# Patient Record
Sex: Male | Born: 1990 | Race: White | Hispanic: No | Marital: Single | Smoking: Current every day smoker
Health system: Southern US, Community
[De-identification: ages and names within clinical notes are randomized; demographics above are authoritative.]

---
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
## 2009-04-23 ENCOUNTER — Emergency Department (HOSPITAL_COMMUNITY): Admission: EM | Admit: 2009-04-23 | Discharge: 2009-04-23 | Payer: Self-pay | Admitting: Emergency Medicine

## 2018-01-01 ENCOUNTER — Emergency Department (HOSPITAL_COMMUNITY): Payer: No Typology Code available for payment source

## 2018-01-01 ENCOUNTER — Emergency Department (HOSPITAL_COMMUNITY)
Admission: EM | Admit: 2018-01-01 | Discharge: 2018-01-01 | Disposition: A | Discharge status: Home / Self Care | Payer: No Typology Code available for payment source | Attending: Emergency Medicine | Admitting: Emergency Medicine

## 2018-01-01 ENCOUNTER — Encounter (HOSPITAL_COMMUNITY): Payer: Self-pay | Admitting: Emergency Medicine

## 2018-01-01 ENCOUNTER — Other Ambulatory Visit: Payer: Self-pay

## 2018-01-01 DIAGNOSIS — Y939 Activity, unspecified: Secondary | ICD-10-CM | POA: Insufficient documentation

## 2018-01-01 DIAGNOSIS — S40021A Contusion of right upper arm, initial encounter: Secondary | ICD-10-CM | POA: Diagnosis not present

## 2018-01-01 DIAGNOSIS — Y92410 Unspecified street and highway as the place of occurrence of the external cause: Secondary | ICD-10-CM | POA: Insufficient documentation

## 2018-01-01 DIAGNOSIS — M542 Cervicalgia: Secondary | ICD-10-CM | POA: Insufficient documentation

## 2018-01-01 DIAGNOSIS — R51 Headache: Secondary | ICD-10-CM | POA: Diagnosis not present

## 2018-01-01 DIAGNOSIS — M79651 Pain in right thigh: Secondary | ICD-10-CM | POA: Diagnosis not present

## 2018-01-01 DIAGNOSIS — M79652 Pain in left thigh: Secondary | ICD-10-CM | POA: Insufficient documentation

## 2018-01-01 DIAGNOSIS — S301XXA Contusion of abdominal wall, initial encounter: Secondary | ICD-10-CM | POA: Diagnosis not present

## 2018-01-01 DIAGNOSIS — S40022A Contusion of left upper arm, initial encounter: Secondary | ICD-10-CM | POA: Diagnosis not present

## 2018-01-01 DIAGNOSIS — S0001XA Abrasion of scalp, initial encounter: Secondary | ICD-10-CM | POA: Insufficient documentation

## 2018-01-01 DIAGNOSIS — T07XXXA Unspecified multiple injuries, initial encounter: Secondary | ICD-10-CM

## 2018-01-01 DIAGNOSIS — Y999 Unspecified external cause status: Secondary | ICD-10-CM | POA: Insufficient documentation

## 2018-01-01 LAB — COMPREHENSIVE METABOLIC PANEL
ALT: 160 U/L — ABNORMAL HIGH (ref 0–44)
AST: 74 U/L — AB (ref 15–41)
Albumin: 4.1 g/dL (ref 3.5–5.0)
Alkaline Phosphatase: 74 U/L (ref 38–126)
Anion gap: 11 (ref 5–15)
BILIRUBIN TOTAL: 0.6 mg/dL (ref 0.3–1.2)
BUN: 11 mg/dL (ref 6–20)
CO2: 21 mmol/L — ABNORMAL LOW (ref 22–32)
CREATININE: 1.04 mg/dL (ref 0.61–1.24)
Calcium: 9 mg/dL (ref 8.9–10.3)
Chloride: 108 mmol/L (ref 98–111)
Glucose, Bld: 95 mg/dL (ref 70–99)
POTASSIUM: 3.9 mmol/L (ref 3.5–5.1)
Sodium: 140 mmol/L (ref 135–145)
TOTAL PROTEIN: 7.6 g/dL (ref 6.5–8.1)

## 2018-01-01 LAB — LIPASE, BLOOD: LIPASE: 29 U/L (ref 11–51)

## 2018-01-01 LAB — URINALYSIS, ROUTINE W REFLEX MICROSCOPIC
Bilirubin Urine: NEGATIVE
Glucose, UA: NEGATIVE mg/dL
Hgb urine dipstick: NEGATIVE
KETONES UR: NEGATIVE mg/dL
LEUKOCYTES UA: NEGATIVE
NITRITE: NEGATIVE
PROTEIN: NEGATIVE mg/dL
Specific Gravity, Urine: 1.021 (ref 1.005–1.030)
pH: 5 (ref 5.0–8.0)

## 2018-01-01 LAB — CBC WITH DIFFERENTIAL/PLATELET
BASOS ABS: 0.1 10*3/uL (ref 0.0–0.1)
Basophils Relative: 0 %
EOS ABS: 0.1 10*3/uL (ref 0.0–0.7)
EOS PCT: 1 %
HCT: 44 % (ref 39.0–52.0)
Hemoglobin: 14.4 g/dL (ref 13.0–17.0)
Lymphocytes Relative: 19 %
Lymphs Abs: 3 10*3/uL (ref 0.7–4.0)
MCH: 27.4 pg (ref 26.0–34.0)
MCHC: 32.7 g/dL (ref 30.0–36.0)
MCV: 83.8 fL (ref 78.0–100.0)
Monocytes Absolute: 1.1 10*3/uL — ABNORMAL HIGH (ref 0.1–1.0)
Monocytes Relative: 6 %
NEUTROS PCT: 74 %
Neutro Abs: 12.1 10*3/uL — ABNORMAL HIGH (ref 1.7–7.7)
PLATELETS: 290 10*3/uL (ref 150–400)
RBC: 5.25 MIL/uL (ref 4.22–5.81)
RDW: 14.4 % (ref 11.5–15.5)
WBC: 16.3 10*3/uL — AB (ref 4.0–10.5)

## 2018-01-01 LAB — CK: CK TOTAL: 484 U/L — AB (ref 49–397)

## 2018-01-01 MED ORDER — FENTANYL CITRATE (PF) 100 MCG/2ML IJ SOLN
50.0000 ug | Freq: Once | INTRAMUSCULAR | Status: AC
Start: 2018-01-01 — End: 2018-01-01
  Administered 2018-01-01: 50 ug via INTRAVENOUS
  Filled 2018-01-01: qty 2

## 2018-01-01 MED ORDER — IOPAMIDOL (ISOVUE-300) INJECTION 61%
100.0000 mL | Freq: Once | INTRAVENOUS | Status: AC | PRN
  Administered 2018-01-01: 100 mL via INTRAVENOUS

## 2018-01-01 MED ORDER — ACETAMINOPHEN 325 MG PO TABS: 650.0000 mg | ORAL_TABLET | Freq: Four times a day (QID) | ORAL | 0 refills | Status: DC | PRN

## 2018-01-01 NOTE — ED Notes (Addendum)
Pt took of c-collar and refuses to wear it.

## 2018-01-01 NOTE — ED Notes (Signed)
ED Provider at bedside. 
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
## 2018-01-01 NOTE — ED Notes (Signed)
Patient transported to CT 
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
## 2018-02-15 ENCOUNTER — Other Ambulatory Visit: Payer: Self-pay

## 2018-02-15 ENCOUNTER — Emergency Department (HOSPITAL_COMMUNITY)
Admission: EM | Admit: 2018-02-15 | Discharge: 2018-02-15 | Disposition: A | Discharge status: Home / Self Care | Payer: Self-pay | Attending: Emergency Medicine | Admitting: Emergency Medicine

## 2018-02-15 ENCOUNTER — Encounter (HOSPITAL_COMMUNITY): Payer: Self-pay | Admitting: *Deleted

## 2018-02-15 ENCOUNTER — Emergency Department (HOSPITAL_COMMUNITY): Payer: Self-pay

## 2018-02-15 DIAGNOSIS — J40 Bronchitis, not specified as acute or chronic: Secondary | ICD-10-CM | POA: Insufficient documentation

## 2018-02-15 DIAGNOSIS — F17210 Nicotine dependence, cigarettes, uncomplicated: Secondary | ICD-10-CM | POA: Insufficient documentation

## 2018-02-15 DIAGNOSIS — Z79899 Other long term (current) drug therapy: Secondary | ICD-10-CM | POA: Insufficient documentation

## 2018-02-15 DIAGNOSIS — J209 Acute bronchitis, unspecified: Secondary | ICD-10-CM

## 2018-02-15 MED ORDER — IBUPROFEN 800 MG PO TABS
800.0000 mg | ORAL_TABLET | Freq: Once | ORAL | Status: AC
  Administered 2018-02-15: 800 mg via ORAL
  Filled 2018-02-15: qty 1

## 2018-02-15 MED ORDER — ALBUTEROL SULFATE (2.5 MG/3ML) 0.083% IN NEBU
2.5000 mg | INHALATION_SOLUTION | Freq: Once | RESPIRATORY_TRACT | Status: AC
  Administered 2018-02-15: 2.5 mg via RESPIRATORY_TRACT
  Filled 2018-02-15: qty 3

## 2018-02-15 MED ORDER — PREDNISONE 20 MG PO TABS
40.0000 mg | ORAL_TABLET | Freq: Once | ORAL | Status: AC
  Administered 2018-02-15: 40 mg via ORAL
  Filled 2018-02-15: qty 2

## 2018-02-15 MED ORDER — AZITHROMYCIN 250 MG PO TABS
500.0000 mg | ORAL_TABLET | Freq: Once | ORAL | Status: AC
  Administered 2018-02-15: 500 mg via ORAL
  Filled 2018-02-15: qty 2

## 2018-02-15 MED ORDER — DEXAMETHASONE 4 MG PO TABS: 4.0000 mg | ORAL_TABLET | Freq: Two times a day (BID) | ORAL | 0 refills | Status: DC

## 2018-02-15 MED ORDER — ALBUTEROL SULFATE HFA 108 (90 BASE) MCG/ACT IN AERS
2.0000 | INHALATION_SPRAY | Freq: Once | RESPIRATORY_TRACT | Status: AC
  Administered 2018-02-15: 2 via RESPIRATORY_TRACT
  Filled 2018-02-15: qty 6.7

## 2018-02-15 MED ORDER — AZITHROMYCIN 250 MG PO TABS: ORAL_TABLET | ORAL | 0 refills | Status: DC

## 2018-02-15 NOTE — ED Triage Notes (Signed)
Pt c/o cough with congestion x one week; pt states he has been coughing up green, white, yellow sputum

## 2018-02-15 NOTE — ED Provider Notes (Signed)
[facility] EMERGENCY DEPARTMENT Provider Note   CSN: [id number] Arrival date & time: 02/15/18  1007     History   Chief Complaint Chief Complaint  Patient presents with  . Cough    HPI Cameron Logan is a 27 y.o. male.  The history is provided by the patient.  URI   This is a new problem. The current episode started more than 1 week ago. The problem has been gradually worsening. The maximum temperature recorded prior to his arrival was 101 to 101.9 F. The fever has been present for 3 to 4 days. Associated symptoms include abdominal pain, diarrhea, nausea, vomiting, congestion, headaches, sneezing, sore throat and cough. Pertinent negatives include no chest pain, no dysuria, no neck pain, no rash and no wheezing. He has tried other medications (dayquil and nyquil and robatussin) for the symptoms. The treatment provided mild relief.    History reviewed. No pertinent past medical history.  There are no active problems to display for this patient.   Past Surgical History:  Procedure Laterality Date  . CLUB FOOT RELEASE  1989/08/12        Home Medications    Prior to Admission medications   Medication Sig Start Date End Date Taking? Authorizing Provider  acetaminophen (TYLENOL) 325 MG tablet Take 2 tablets (650 mg total) by mouth every 6 (six) hours as needed. 01/01/18   Mesner, Barbara Cower, MD    Family History History reviewed. No pertinent family history.  Social History Social History   Tobacco Use  . Smoking status: Current Every Day Smoker    Packs/day: 1.00    Types: Cigarettes  . Smokeless tobacco: Never Used  Substance Use Topics  . Alcohol use: Yes  . Drug use: Yes    Types: Marijuana     Allergies   Sulfa antibiotics   Review of Systems Review of Systems  Constitutional: Positive for chills and fever. Negative for activity change.       All ROS Neg except as noted in HPI  HENT: Positive for congestion, sneezing and sore throat. Negative for  nosebleeds.   Eyes: Negative for photophobia and discharge.  Respiratory: Positive for cough. Negative for shortness of breath and wheezing.   Cardiovascular: Negative for chest pain and palpitations.  Gastrointestinal: Positive for abdominal pain, diarrhea, nausea and vomiting. Negative for blood in stool.  Genitourinary: Negative for dysuria, frequency and hematuria.  Musculoskeletal: Negative for arthralgias, back pain and neck pain.  Skin: Negative.  Negative for rash.  Neurological: Positive for headaches. Negative for dizziness, seizures and speech difficulty.  Psychiatric/Behavioral: Negative for confusion and hallucinations.     Physical Exam Updated Vital Signs BP 121/63 (BP Location: Right Arm)   Pulse 91   Temp 98.3 F (36.8 C) (Oral)   Resp 18   Ht 5' 8.5" (1.74 m)   Wt 83.9 kg   SpO2 98%   BMI 27.72 kg/m   Physical Exam  Constitutional: He is oriented to person, place, and time. He appears well-developed and well-nourished.  Non-toxic appearance.  HENT:  Head: Normocephalic.  Right Ear: Tympanic membrane and external ear normal.  Left Ear: Tympanic membrane and external ear normal.  Nasal congestion  Eyes: Pupils are equal, round, and reactive to light. EOM and lids are normal.  Neck: Normal range of motion. Neck supple. Carotid bruit is not present.  Cardiovascular: Normal rate, regular rhythm, normal heart sounds, intact distal pulses and normal pulses.  Pulmonary/Chest: Breath sounds normal. No respiratory distress.  Abdominal:  Soft. Bowel sounds are normal. There is no tenderness. There is no guarding.  Musculoskeletal: Normal range of motion.  Lymphadenopathy:       Head (right side): No submandibular adenopathy present.       Head (left side): No submandibular adenopathy present.    He has no cervical adenopathy.  Neurological: He is alert and oriented to person, place, and time. He has normal strength. No cranial nerve deficit or sensory deficit.  Skin:  Skin is warm and dry.  Psychiatric: He has a normal mood and affect. His speech is normal.  Nursing note and vitals reviewed.    ED Treatments / Results  Labs (all labs ordered are listed, but only abnormal results are displayed) Labs Reviewed - No data to display  EKG None  Radiology No results found.  Procedures Procedures (including critical care time)  Medications Ordered in ED Medications - No data to display   Initial Impression / Assessment and Plan / ED Course  I have reviewed the triage vital signs and the nursing notes.  Pertinent labs & imaging results that were available during my care of the patient were reviewed by me and considered in my medical decision making (see chart for details).      Final Clinical Impressions(s) / ED Diagnoses MDM  Vital signs reviewed.  Pulse oximetry is 96% on room air.  Within normal limits by my interpretation.  Patient treated with albuterol inhaler and nebulizer.  Chest x-ray shows a stable chest with no active cardiopulmonary disease.  Examination favors bronchitis. Patient advised to use the albuterol 2 puffs every 4 hours.  He is prescribed Decadron 2 times daily and Zithromax daily.  Have asked him to increase fluids, and to wash hands frequently.  I have provided a mask for the patient and asked him to use until symptoms are resolved.   Final diagnoses:  Acute bronchitis, unspecified organism    ED Discharge Orders         Ordered    azithromycin (ZITHROMAX) 250 MG tablet     02/15/18 1223    dexamethasone (DECADRON) 4 MG tablet  2 times daily with meals     02/15/18 1223           Ivery Quale, PA-C 02/15/18 2047    Bethann Berkshire, MD 02/16/18 [phone]

## 2018-02-15 NOTE — Discharge Instructions (Addendum)
Vital signs are within normal limits.  Chest x-ray is negative for pneumonia or acute problem.  Your examination favors acute bronchitis.  Please use Zithromax 1 tablet daily starting September 5.  Please use 2 puffs of albuterol every 4 hours.  Use Decadron 2 times daily with food.  Please increase fluids.  Please wash hands frequently.  Use mask until symptoms have resolved.

## 2018-08-31 ENCOUNTER — Other Ambulatory Visit: Payer: Self-pay

## 2018-08-31 ENCOUNTER — Ambulatory Visit (INDEPENDENT_AMBULATORY_CARE_PROVIDER_SITE_OTHER): Payer: Commercial Managed Care - PPO

## 2018-08-31 ENCOUNTER — Ambulatory Visit (INDEPENDENT_AMBULATORY_CARE_PROVIDER_SITE_OTHER): Payer: Commercial Managed Care - PPO | Admitting: Physician Assistant

## 2018-08-31 ENCOUNTER — Encounter: Payer: Self-pay | Admitting: Physician Assistant

## 2018-08-31 VITALS — BP 140/92 | HR 85 | Temp 97.8°F | Ht 68.5 in | Wt 200.0 lb

## 2018-08-31 DIAGNOSIS — M25551 Pain in right hip: Secondary | ICD-10-CM

## 2018-08-31 DIAGNOSIS — M25552 Pain in left hip: Secondary | ICD-10-CM

## 2018-08-31 DIAGNOSIS — M25561 Pain in right knee: Secondary | ICD-10-CM | POA: Diagnosis not present

## 2018-08-31 DIAGNOSIS — G8929 Other chronic pain: Secondary | ICD-10-CM

## 2018-08-31 DIAGNOSIS — M25562 Pain in left knee: Secondary | ICD-10-CM

## 2018-08-31 MED ORDER — MELOXICAM 7.5 MG PO TABS: 7.5000 mg | ORAL_TABLET | Freq: Every day | ORAL | 1 refills | Status: AC

## 2018-08-31 NOTE — Patient Instructions (Signed)
Knee Exercises Ask your health care provider which exercises are safe for you. Do exercises exactly as told by your health care provider and adjust them as directed. It is normal to feel mild stretching, pulling, tightness, or discomfort as you do these exercises, but you should stop right away if you feel sudden pain or your pain gets worse.Do not begin these exercises until told by your health care provider. STRETCHING AND RANGE OF MOTION EXERCISES These exercises warm up your muscles and joints and improve the movement and flexibility of your knee. These exercises also help to relieve pain, numbness, and tingling. Exercise A: Knee Extension, Prone 1. Lie on your abdomen on a bed. 2. Place your left / right knee just beyond the edge of the surface so your knee is not on the bed. You can put a towel under your left / right thigh just above your knee for comfort. 3. Relax your leg muscles and allow gravity to straighten your knee. You should feel a stretch behind your left / right knee. 4. Hold this position for __________ seconds. 5. Scoot up so your knee is supported between repetitions. Repeat __________ times. Complete this stretch __________ times a day. Exercise B: Knee Flexion, Active  1. Lie on your back with both knees straight. If this causes back discomfort, bend your left / right knee so your foot is flat on the floor. 2. Slowly slide your left / right heel back toward your buttocks until you feel a gentle stretch in the front of your knee or thigh. 3. Hold this position for __________ seconds. 4. Slowly slide your left / right heel back to the starting position. Repeat __________ times. Complete this exercise __________ times a day. Exercise C: Quadriceps, Prone  1. Lie on your abdomen on a firm surface, such as a bed or padded floor. 2. Bend your left / right knee and hold your ankle. If you cannot reach your ankle or pant leg, loop a belt around your foot and grab the belt  instead. 3. Gently pull your heel toward your buttocks. Your knee should not slide out to the side. You should feel a stretch in the front of your thigh and knee. 4. Hold this position for __________ seconds. Repeat __________ times. Complete this stretch __________ times a day. Exercise D: Hamstring, Supine 1. Lie on your back. 2. Loop a belt or towel over the ball of your left / right foot. The ball of your foot is on the walking surface, right under your toes. 3. Straighten your left / right knee and slowly pull on the belt to raise your leg until you feel a gentle stretch behind your knee. ? Do not let your left / right knee bend while you do this. ? Keep your other leg flat on the floor. 4. Hold this position for __________ seconds. Repeat __________ times. Complete this stretch __________ times a day. STRENGTHENING EXERCISES These exercises build strength and endurance in your knee. Endurance is the ability to use your muscles for a long time, even after they get tired. Exercise E: Quadriceps, Isometric  1. Lie on your back with your left / right leg extended and your other knee bent. Put a rolled towel or small pillow under your knee if told by your health care provider. 2. Slowly tense the muscles in the front of your left / right thigh. You should see your kneecap slide up toward your hip or see increased dimpling just above the knee. This   motion will push the back of the knee toward the floor. 3. For __________ seconds, keep the muscle as tight as you can without increasing your pain. 4. Relax the muscles slowly and completely. Repeat __________ times. Complete this exercise __________ times a day. Exercise F: Straight Leg Raises - Quadriceps 1. Lie on your back with your left / right leg extended and your other knee bent. 2. Tense the muscles in the front of your left / right thigh. You should see your kneecap slide up or see increased dimpling just above the knee. Your thigh may  even shake a bit. 3. Keep these muscles tight as you raise your leg 4-6 inches (10-15 cm) off the floor. Do not let your knee bend. 4. Hold this position for __________ seconds. 5. Keep these muscles tense as you lower your leg. 6. Relax your muscles slowly and completely after each repetition. Repeat __________ times. Complete this exercise __________ times a day. Exercise G: Hamstring, Isometric 1. Lie on your back on a firm surface. 2. Bend your left / right knee approximately __________ degrees. 3. Dig your left / right heel into the surface as if you are trying to pull it toward your buttocks. Tighten the muscles in the back of your thighs to dig as hard as you can without increasing any pain. 4. Hold this position for __________ seconds. 5. Release the tension gradually and allow your muscles to relax completely for __________ seconds after each repetition. Repeat __________ times. Complete this exercise __________ times a day. Exercise H: Hamstring Curls  If told by your health care provider, do this exercise while wearing ankle weights. Begin with __________ weights. Then increase the weight by 1 lb (0.5 kg) increments. Do not wear ankle weights that are more than __________. 1. Lie on your abdomen with your legs straight. 2. Bend your left / right knee as far as you can without feeling pain. Keep your hips flat against the floor. 3. Hold this position for __________ seconds. 4. Slowly lower your leg to the starting position.  Repeat __________ times. Complete this exercise __________ times a day. Exercise I: Squats (Quadriceps) 1. Stand in front of a table, with your feet and knees pointing straight ahead. You may rest your hands on the table for balance but not for support. 2. Slowly bend your knees and lower your hips like you are going to sit in a chair. ? Keep your weight over your heels, not over your toes. ? Keep your lower legs upright so they are parallel with the table  legs. ? Do not let your hips go lower than your knees. ? Do not bend lower than told by your health care provider. ? If your knee pain increases, do not bend as low. 3. Hold the squat position for __________ seconds. 4. Slowly push with your legs to return to standing. Do not use your hands to pull yourself to standing. Repeat __________ times. Complete this exercise __________ times a day. Exercise J: Wall Slides (Quadriceps)  1. Lean your back against a smooth wall or door while you walk your feet out 18-24 inches (46-61 cm) from it. 2. Place your feet hip-width apart. 3. Slowly slide down the wall or door until your knees bend __________ degrees. Keep your knees over your heels, not over your toes. Keep your knees in line with your hips. 4. Hold for __________ seconds. Repeat __________ times. Complete this exercise __________ times a day. Exercise K: Straight Leg Raises -   Hip Abductors 1. Lie on your side with your left / right leg in the top position. Lie so your head, shoulder, knee, and hip line up. You may bend your bottom knee to help you keep your balance. 2. Roll your hips slightly forward so your hips are stacked directly over each other and your left / right knee is facing forward. 3. Leading with your heel, lift your top leg 4-6 inches (10-15 cm). You should feel the muscles in your outer hip lifting. ? Do not let your foot drift forward. ? Do not let your knee roll toward the ceiling. 4. Hold this position for __________ seconds. 5. Slowly return your leg to the starting position. 6. Let your muscles relax completely after each repetition. Repeat __________ times. Complete this exercise __________ times a day. Exercise L: Straight Leg Raises - Hip Extensors 1. Lie on your abdomen on a firm surface. You can put a pillow under your hips if that is more comfortable. 2. Tense the muscles in your buttocks and lift your left / right leg about 4-6 inches (10-15 cm). Keep your knee  straight as you lift your leg. 3. Hold this position for __________ seconds. 4. Slowly lower your leg to the starting position. 5. Let your leg relax completely after each repetition. Repeat __________ times. Complete this exercise __________ times a day. This information is not intended to replace advice given to you by your health care provider. Make sure you discuss any questions you have with your health care provider. Document Released: 04/14/2005 Document Revised: 02/23/2016 Document Reviewed: 04/06/2015 [employer] Interactive Patient Education  2018 [employer] Hip Exercises Ask your health care provider which exercises are safe for you. Do exercises exactly as told by your health care provider and adjust them as directed. It is normal to feel mild stretching, pulling, tightness, or discomfort as you do these exercises, but you should stop right away if you feel sudden pain or your pain gets worse.Do not begin these exercises until told by your health care provider. Stretching and range of motion exercises These exercises warm up your muscles and joints and improve the movement and flexibility of your hip. These exercises also help to relieve pain, numbness, and tingling. Exercise A: Hamstrings, supine  1. Lie on your back. 2. Loop a belt or towel over the ball of your left / rightfoot. The ball of your foot is on the walking surface, right under your toes. 3. Straighten your left / rightknee and slowly pull on the belt to raise your leg. ? Do not let your left / right knee bend while you do this. ? Keep your other leg flat on the floor. ? Raise the left / right leg until you feel a gentle stretch behind your left / right knee or thigh. 4. Hold this position for __________ seconds. 5. Slowly return your leg to the starting position. Repeat __________ times. Complete this stretch __________ times a day. Exercise B: Hip rotators  1. Lie on your back on a firm surface. 2. Hold your  left / right knee with your left / right hand. Hold your ankle with your other hand. 3. Gently pull your left / right knee and rotate your lower leg toward your other shoulder. ? Pull until you feel a stretch in your buttocks. ? Keep your hips and shoulders firmly planted while you do this stretch. 4. Hold this position for __________ seconds. Repeat __________ times. Complete this stretch __________ times a day. Exercise   C: V-sit (hamstrings and adductors)  1. Sit on the floor with your legs extended in a large "V" shape. Keep your knees straight during this exercise. 2. Start with your head and chest upright, then bend at your waist to reach for your left foot (position A). You should feel a stretch in your right inner thigh. 3. Hold this position for __________ seconds. Then slowly return to the upright position. 4. Bend at your waist to reach forward (position B). You should feel a stretch behind both of your thighs and knees. 5. Hold this position for __________ seconds. Then slowly return to the upright position. 6. Bend at your waist to reach for your right foot (position C). You should feel a stretch in your left inner thigh. 7. Hold this position for __________ seconds. Then slowly return to the upright position. Repeat __________ times. Complete this stretch __________ times a day. Exercise D: Lunge (hip flexors)  1. Place your left / right knee on the floor and bend your other knee so that is directly over your ankle. You should be half-kneeling. 2. Keep good posture with your head over your shoulders. 3. Tighten your buttocks to point your tailbone downward. This helps your back to keep from arching too much. 4. You should feel a gentle stretch in the front of your left / right thigh and hip. If you do not feel any resistance, slightly slide your other foot forward and then slowly lunge forward so your knee once again lines up over your ankle. 5. Make sure your tailbone continues to  point downward. 6. Hold this position for __________ seconds. Repeat __________ times. Complete this stretch __________ times a day. Strengthening exercises These exercises build strength and endurance in your hip. Endurance is the ability to use your muscles for a long time, even after they get tired. Exercise E: Bridge (hip extensors)  1. Lie on your back on a firm surface with your knees bent and your feet flat on the floor. 2. Tighten your buttocks muscles and lift your bottom off the floor until the trunk of your body is level with your thighs. ? Do not arch your back. ? You should feel the muscles working in your buttocks and the back of your thighs. If you do not feel these muscles, slide your feet 1-2 inches (2.5-5 cm) farther away from your buttocks. 3. Hold this position for __________ seconds. 4. Slowly lower your hips to the starting position. 5. Let your muscles relax completely between repetitions. 6. If this exercise is too easy, try doing it with your arms crossed over your chest. Repeat __________ times. Complete this exercise __________ times a day. Exercise F: Straight leg raises - hip abductors  1. Lie on your side with your left / right leg in the top position. Lie so your head, shoulder, knee, and hip line up with each other. You may bend your bottom knee to help you balance. 2. Roll your hips slightly forward, so your hips are stacked directly over each other and your left / right knee is facing forward. 3. Leading with your heel, lift your top leg 4-6 inches (10-15 cm). You should feel the muscles in your outer hip lifting. ? Do not let your foot drift forward. ? Do not let your knee roll toward the ceiling. 4. Hold this position for __________ seconds. 5. Slowly return to the starting position. 6. Let your muscles relax completely between repetitions. Repeat __________ times. Complete this exercise __________ times   a day. Exercise G: Straight leg raises - hip  adductors  1. Lie on your side with your left / right leg in the bottom position. Lie so your head, shoulder, knee, and hip line up. You may place your upper foot in front to help you balance. 2. Roll your hips slightly forward, so your hips are stacked directly over each other and your left / right knee is facing forward. 3. Tense the muscles in your inner thigh and lift your bottom leg 4-6 inches (10-15 cm). 4. Hold this position for __________ seconds. 5. Slowly return to the starting position. 6. Let your muscles relax completely between repetitions. Repeat __________ times. Complete this exercise __________ times a day. Exercise H: Straight leg raises - quadriceps  1. Lie on your back with your left / right leg extended and your other knee bent. 2. Tense the muscles in the front of your left / right thigh. When you do this, you should see your kneecap slide up or see increased dimpling just above your knee. 3. Tighten these muscles even more and raise your leg 4-6 inches (10-15 cm) off the floor. 4. Hold this position for __________ seconds. 5. Keep these muscles tense as you lower your leg. 6. Relax the muscles slowly and completely between repetitions. Repeat __________ times. Complete this exercise __________ times a day. Exercise I: Hip abductors, standing 1. Tie one end of a rubber exercise band or tubing to a secure surface, such as a table or pole. 2. Loop the other end of the band or tubing around your left / right ankle. 3. Keeping your ankle with the band or tubing directly opposite of the secured end, step away until there is tension in the tubing or band. Hold onto a chair as needed for balance. 4. Lift your left / right leg out to your side. While you do this: ? Keep your back upright. ? Keep your shoulders over your hips. ? Keep your toes pointing forward. ? Make sure to use your hip muscles to lift your leg. Do not "throw" your leg or tip your body to lift your leg. 5.  Hold this position for __________ seconds. 6. Slowly return to the starting position. Repeat __________ times. Complete this exercise __________ times a day. Exercise J: Squats (quadriceps) 1. Stand in a door frame so your feet and knees are in line with the frame. You may place your hands on the frame for balance. 2. Slowly bend your knees and lower your hips like you are going to sit in a chair. ? Keep your lower legs in a straight-up-and-down position. ? Do not let your hips go lower than your knees. ? Do not bend your knees lower than told by your health care provider. ? If your hip pain increases, do not bend as low. 3. Hold this position for ___________ seconds. 4. Slowly push with your legs to return to standing. Do not use your hands to pull yourself to standing. Repeat __________ times. Complete this exercise __________ times a day. This information is not intended to replace advice given to you by your health care provider. Make sure you discuss any questions you have with your health care provider. Document Released: 06/18/2005 Document Revised: 10/04/2017 Document Reviewed: 05/26/2015 [employer] Interactive Patient Education  2019 [employer]  

## 2018-09-03 DIAGNOSIS — M25562 Pain in left knee: Secondary | ICD-10-CM

## 2018-09-03 DIAGNOSIS — M25552 Pain in left hip: Principal | ICD-10-CM

## 2018-09-03 DIAGNOSIS — M25551 Pain in right hip: Principal | ICD-10-CM

## 2018-09-03 DIAGNOSIS — M25561 Pain in right knee: Secondary | ICD-10-CM

## 2018-09-03 DIAGNOSIS — G8929 Other chronic pain: Secondary | ICD-10-CM | POA: Insufficient documentation

## 2018-09-03 NOTE — Progress Notes (Signed)
BP (!) 140/92   Pulse 85   Temp 97.8 F (36.6 C) (Oral)   Ht 5' 8.5" (1.74 m)   Wt 200 lb (90.7 kg)   BMI 29.97 kg/m    Subjective:    Patient ID: Cameron Logan, male    DOB: 1990-08-12, 28 y.o.   MRN: [id number]  HPI: Cameron Logan is a 28 y.o. male presenting on 08/31/2018 for New Patient (Initial Visit); Headache; and Generalized Body Aches (started Tuesday )  He comes in as a new patient and has reported chronic hip and knee pain. He played some sports as kid, but no severe injury. broken bone was collar bone age 11 and slight fracture of vertebrate age 59.  Pain will be in the posterior legs and worsened by standing on concrete floors.    History reviewed. No pertinent past medical history. Relevant past medical, surgical, family and social history reviewed and updated as indicated. Interim medical history since our last visit reviewed. Allergies and medications reviewed and updated. DATA REVIEWED: CHART IN EPIC  Family History reviewed for pertinent findings.  Review of Systems  Constitutional: Negative.  Negative for appetite change and fatigue.  Eyes: Negative for pain and visual disturbance.  Respiratory: Negative.  Negative for cough, chest tightness, shortness of breath and wheezing.   Cardiovascular: Negative.  Negative for chest pain, palpitations and leg swelling.  Gastrointestinal: Negative.  Negative for abdominal pain, diarrhea, nausea and vomiting.  Genitourinary: Negative.   Musculoskeletal: Positive for arthralgias, back pain and myalgias.  Skin: Negative.  Negative for color change and rash.  Neurological: Negative.  Negative for weakness, numbness and headaches.  Psychiatric/Behavioral: Negative.     Allergies as of 08/31/2018      Reactions   Sulfa Antibiotics Swelling      Medication List       Accurate as of August 31, 2018 11:59 PM. Always use your most recent med list.        meloxicam 7.5 MG tablet Commonly known as:  MOBIC Take  1 tablet (7.5 mg total) by mouth daily.          Objective:    BP (!) 140/92   Pulse 85   Temp 97.8 F (36.6 C) (Oral)   Ht 5' 8.5" (1.74 m)   Wt 200 lb (90.7 kg)   BMI 29.97 kg/m   Allergies  Allergen Reactions  . Sulfa Antibiotics Swelling    Wt Readings from Last 3 Encounters:  08/31/18 200 lb (90.7 kg)  02/15/18 185 lb (83.9 kg)  01/01/18 203 lb (92.1 kg)    Physical Exam Vitals signs and nursing note reviewed.  Constitutional:      General: He is not in acute distress.    Appearance: He is well-developed.  HENT:     Head: Normocephalic and atraumatic.  Eyes:     Conjunctiva/sclera: Conjunctivae normal.     Pupils: Pupils are equal, round, and reactive to light.  Cardiovascular:     Rate and Rhythm: Normal rate and regular rhythm.     Heart sounds: Normal heart sounds.  Pulmonary:     Effort: Pulmonary effort is normal. No respiratory distress.     Breath sounds: Normal breath sounds.  Musculoskeletal:     Left hip: He exhibits decreased range of motion and decreased strength.     Right knee: He exhibits no deformity. Tenderness found.     Left knee: He exhibits no deformity. Tenderness found.  Skin:  General: Skin is warm and dry.  Psychiatric:        Behavior: Behavior normal.     Results for orders placed or performed during the hospital encounter of 01/01/18  Comprehensive metabolic panel  Result Value Ref Range   Sodium 140 135 - 145 mmol/L   Potassium 3.9 3.5 - 5.1 mmol/L   Chloride 108 98 - 111 mmol/L   CO2 21 (L) 22 - 32 mmol/L   Glucose, Bld 95 70 - 99 mg/dL   BUN 11 6 - 20 mg/dL   Creatinine, Ser 0.24 0.61 - 1.24 mg/dL   Calcium 9.0 8.9 - 09.7 mg/dL   Total Protein 7.6 6.5 - 8.1 g/dL   Albumin 4.1 3.5 - 5.0 g/dL   AST 74 (H) 15 - 41 U/L   ALT 160 (H) 0 - 44 U/L   Alkaline Phosphatase 74 38 - 126 U/L   Total Bilirubin 0.6 0.3 - 1.2 mg/dL   GFR calc non Af Amer >60 >60 mL/min   GFR calc Af Amer >60 >60 mL/min   Anion gap 11 5 - 15   CBC with Differential  Result Value Ref Range   WBC 16.3 (H) 4.0 - 10.5 K/uL   RBC 5.25 4.22 - 5.81 MIL/uL   Hemoglobin 14.4 13.0 - 17.0 g/dL   HCT 35.3 29.9 - 24.2 %   MCV 83.8 78.0 - 100.0 fL   MCH 27.4 26.0 - 34.0 pg   MCHC 32.7 30.0 - 36.0 g/dL   RDW 68.3 41.9 - 62.2 %   Platelets 290 150 - 400 K/uL   Neutrophils Relative % 74 %   Neutro Abs 12.1 (H) 1.7 - 7.7 K/uL   Lymphocytes Relative 19 %   Lymphs Abs 3.0 0.7 - 4.0 K/uL   Monocytes Relative 6 %   Monocytes Absolute 1.1 (H) 0.1 - 1.0 K/uL   Eosinophils Relative 1 %   Eosinophils Absolute 0.1 0.0 - 0.7 K/uL   Basophils Relative 0 %   Basophils Absolute 0.1 0.0 - 0.1 K/uL  Lipase, blood  Result Value Ref Range   Lipase 29 11 - 51 U/L  Urinalysis, Routine w reflex microscopic  Result Value Ref Range   Color, Urine YELLOW YELLOW   APPearance HAZY (A) CLEAR   Specific Gravity, Urine 1.021 1.005 - 1.030   pH 5.0 5.0 - 8.0   Glucose, UA NEGATIVE NEGATIVE mg/dL   Hgb urine dipstick NEGATIVE NEGATIVE   Bilirubin Urine NEGATIVE NEGATIVE   Ketones, ur NEGATIVE NEGATIVE mg/dL   Protein, ur NEGATIVE NEGATIVE mg/dL   Nitrite NEGATIVE NEGATIVE   Leukocytes, UA NEGATIVE NEGATIVE  CK  Result Value Ref Range   Total CK 484 (H) 49 - 397 U/L      Assessment & Plan:   1. Chronic pain of both hips - DG HIP UNILAT W OR W/O PELVIS 2-3 VIEWS RIGHT; Future - meloxicam (MOBIC) 7.5 MG tablet; Take 1 tablet (7.5 mg total) by mouth daily.  Dispense: 90 tablet; Refill: 1  2. Chronic pain of both knees - DG Knee 1-2 Views Left; Future - meloxicam (MOBIC) 7.5 MG tablet; Take 1 tablet (7.5 mg total) by mouth daily.  Dispense: 90 tablet; Refill: 1   Continue all other maintenance medications as listed above.  Follow up plan: Return in about 3 months (around 12/01/2018).  Educational handout given for survey  Remus Loffler PA-C [facility] [address]  [place], [place] [address] [phone]   09/03/2018,  8:36 PM

## 2018-09-29 ENCOUNTER — Other Ambulatory Visit: Payer: Self-pay

## 2018-09-29 ENCOUNTER — Ambulatory Visit (INDEPENDENT_AMBULATORY_CARE_PROVIDER_SITE_OTHER): Payer: Commercial Managed Care - PPO | Admitting: Family Medicine

## 2018-09-29 DIAGNOSIS — K529 Noninfective gastroenteritis and colitis, unspecified: Secondary | ICD-10-CM

## 2018-09-29 MED ORDER — ONDANSETRON 4 MG PO TBDP: 4.0000 mg | ORAL_TABLET | Freq: Three times a day (TID) | ORAL | 0 refills | Status: AC | PRN

## 2018-09-29 NOTE — Patient Instructions (Signed)
Food Choices to Help Relieve Diarrhea, Adult  When you have diarrhea, the foods you eat and your eating habits are very important. Choosing the right foods and drinks can help:   Relieve diarrhea.   Replace lost fluids and nutrients.   Prevent dehydration.  What general guidelines should I follow?    Relieving diarrhea   Choose foods with less than 2 g or .07 oz. of fiber per serving.   Limit fats to less than 8 tsp (38 g or 1.34 oz.) a day.   Avoid the following:  ? Foods and beverages sweetened with high-fructose corn syrup, honey, or sugar alcohols such as xylitol, sorbitol, and mannitol.  ? Foods that contain a lot of fat or sugar.  ? Fried, greasy, or spicy foods.  ? High-fiber grains, breads, and cereals.  ? Raw fruits and vegetables.   Eat foods that are rich in probiotics. These foods include dairy products such as yogurt and fermented milk products. They help increase healthy bacteria in the stomach and intestines (gastrointestinal tract, or GI tract).   If you have lactose intolerance, avoid dairy products. These may make your diarrhea worse.   Take medicine to help stop diarrhea (antidiarrheal medicine) only as told by your health care provider.  Replacing nutrients   Eat small meals or snacks every 3-4 hours.   Eat bland foods, such as white rice, toast, or baked potato, until your diarrhea starts to get better. Gradually reintroduce nutrient-rich foods as tolerated or as told by your health care provider. This includes:  ? Well-cooked protein foods.  ? Peeled, seeded, and soft-cooked fruits and vegetables.  ? Low-fat dairy products.   Take vitamin and mineral supplements as told by your health care provider.  Preventing dehydration   Start by sipping water or a special solution to prevent dehydration (oral rehydration solution, ORS). Urine that is clear or pale yellow means that you are getting enough fluid.   Try to drink at least 8-10 cups of fluid each day to help replace lost  fluids.   You may add other liquids in addition to water, such as clear juice or decaffeinated sports drinks, as tolerated or as told by your health care provider.   Avoid drinks with caffeine, such as coffee, tea, or soft drinks.   Avoid alcohol.  What foods are recommended?         The items listed may not be a complete list. Talk with your health care provider about what dietary choices are best for you.  Grains  White rice. White, French, or pita breads (fresh or toasted), including plain rolls, buns, or bagels. White pasta. Saltine, soda, or graham crackers. Pretzels. Low-fiber cereal. Cooked cereals made with water (such as cornmeal, farina, or cream cereals). Plain muffins. Matzo. Melba toast. Zwieback.  Vegetables  Potatoes (without the skin). Most well-cooked and canned vegetables without skins or seeds. Tender lettuce.  Fruits  Apple sauce. Fruits canned in juice. Cooked apricots, cherries, grapefruit, peaches, pears, or plums. Fresh bananas and cantaloupe.  Meats and other protein foods  Baked or boiled chicken. Eggs. Tofu. Fish. Seafood. Smooth nut butters. Ground or well-cooked tender beef, ham, veal, lamb, pork, or poultry.  Dairy  Plain yogurt, kefir, and unsweetened liquid yogurt. Lactose-free milk, buttermilk, skim milk, or soy milk. Low-fat or nonfat hard cheese.  Beverages  Water. Low-calorie sports drinks. Fruit juices without pulp. Strained tomato and vegetable juices. Decaffeinated teas. Sugar-free beverages not sweetened with sugar alcohols. Oral rehydration solutions, if   approved by your health care provider.  Seasoning and other foods  Bouillon, broth, or soups made from recommended foods.  What foods are not recommended?  The items listed may not be a complete list. Talk with your health care provider about what dietary choices are best for you.  Grains  Whole grain, whole wheat, bran, or rye breads, rolls, pastas, and crackers. Wild or brown rice. Whole grain or bran cereals. Barley.  Oats and oatmeal. Corn tortillas or taco shells. Granola. Popcorn.  Vegetables  Raw vegetables. Fried vegetables. Cabbage, broccoli, Brussels sprouts, artichokes, baked beans, beet greens, corn, kale, legumes, peas, sweet potatoes, and yams. Potato skins. Cooked spinach and cabbage.  Fruits  Dried fruit, including raisins and dates. Raw fruits. Stewed or dried prunes. Canned fruits with syrup.  Meat and other protein foods  Fried or fatty meats. Deli meats. Chunky nut butters. Nuts and seeds. Beans and lentils. Bacon. Hot dogs. Sausage.  Dairy  High-fat cheeses. Whole milk, chocolate milk, and beverages made with milk, such as milk shakes. Half-and-half. Cream. sour cream. Ice cream.  Beverages  Caffeinated beverages (such as coffee, tea, soda, or energy drinks). Alcoholic beverages. Fruit juices with pulp. Prune juice. Soft drinks sweetened with high-fructose corn syrup or sugar alcohols. High-calorie sports drinks.  Fats and oils  Butter. Cream sauces. Margarine. Salad oils. Plain salad dressings. Olives. Avocados. Mayonnaise.  Sweets and desserts  Sweet rolls, doughnuts, and sweet breads. Sugar-free desserts sweetened with sugar alcohols such as xylitol and sorbitol.  Seasoning and other foods  Honey. Hot sauce. Chili powder. Gravy. Cream-based or milk-based soups. Pancakes and waffles.  Summary   When you have diarrhea, the foods you eat and your eating habits are very important.   Make sure you get at least 8-10 cups of fluid each day, or enough to keep your urine clear or pale yellow.   Eat bland foods and gradually reintroduce healthy, nutrient-rich foods as tolerated, or as told by your health care provider.   Avoid high-fiber, fried, greasy, or spicy foods.  This information is not intended to replace advice given to you by your health care provider. Make sure you discuss any questions you have with your health care provider.  Document Released: 08/21/2003 Document Revised: 05/28/2016 Document Reviewed:  05/28/2016  [employer]vier Interactive Patient Education  2019 [employer]

## 2018-09-29 NOTE — Progress Notes (Signed)
Telephone visit  Subjective: CC: nausea/ vomiting PCP: Remus Loffler, PA-C LKG:[id number] A Jentsch is a 28 y.o. male calls for telephone consult today. Patient provides verbal consent for consult held via phone.  Location of patient: home Location of provider: [facility] Others present for call: spouse  1. Nausea/vomiting Patient reports onset of nausea and vomiting last evening.  He describes multiple episodes of vomiting and diarrhea that are nonbloody.  He thinks he may have gotten food poisoning but is not sure.  Denies any fevers, abdominal pain, other sick contacts.  He has had some difficulty keeping drink down, particularly Pepto-Bismol he tried to take it but was unsuccessful in keeping it down.   ROS: Per HPI  Allergies  Allergen Reactions  . Sulfa Antibiotics Swelling   No past medical history on file.  Current Outpatient Medications:  .  meloxicam (MOBIC) 7.5 MG tablet, Take 1 tablet (7.5 mg total) by mouth daily., Disp: 90 tablet, Rfl: 1  Assessment/ Plan: 28 y.o. male   1. Gastroenteritis Likely viral gastroenteritis versus food poisoning.  I have recommended that he push oral fluids.  I have sent over Zofran for him to take up to 3 times daily if needed for nausea and vomiting.  A work note will be provided excusing through Monday.  We discussed reasons for emergent evaluation emergency department and patient was good understanding. - ondansetron (ZOFRAN ODT) 4 MG disintegrating tablet; Take 1 tablet (4 mg total) by mouth every 8 (eight) hours as needed for nausea or vomiting.  Dispense: 20 tablet; Refill: 0   Start time: 2:53pm End time: 2:58pm  Total time spent on patient care (including telephone call/ virtual visit): 12 minutes  Ashly Hulen Skains, DO [facility] [phone]

## 2018-12-04 ENCOUNTER — Ambulatory Visit: Payer: Commercial Managed Care - PPO | Admitting: Physician Assistant
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
## 2020-05-17 IMAGING — DX LEFT KNEE - 1-2 VIEW
2 series · 2 of 2 positions shown · non-contrast
Comparison: None.

CLINICAL DATA: Knee pain, no injury.

EXAM:
LEFT KNEE - 1-2 VIEW

[knee ap]
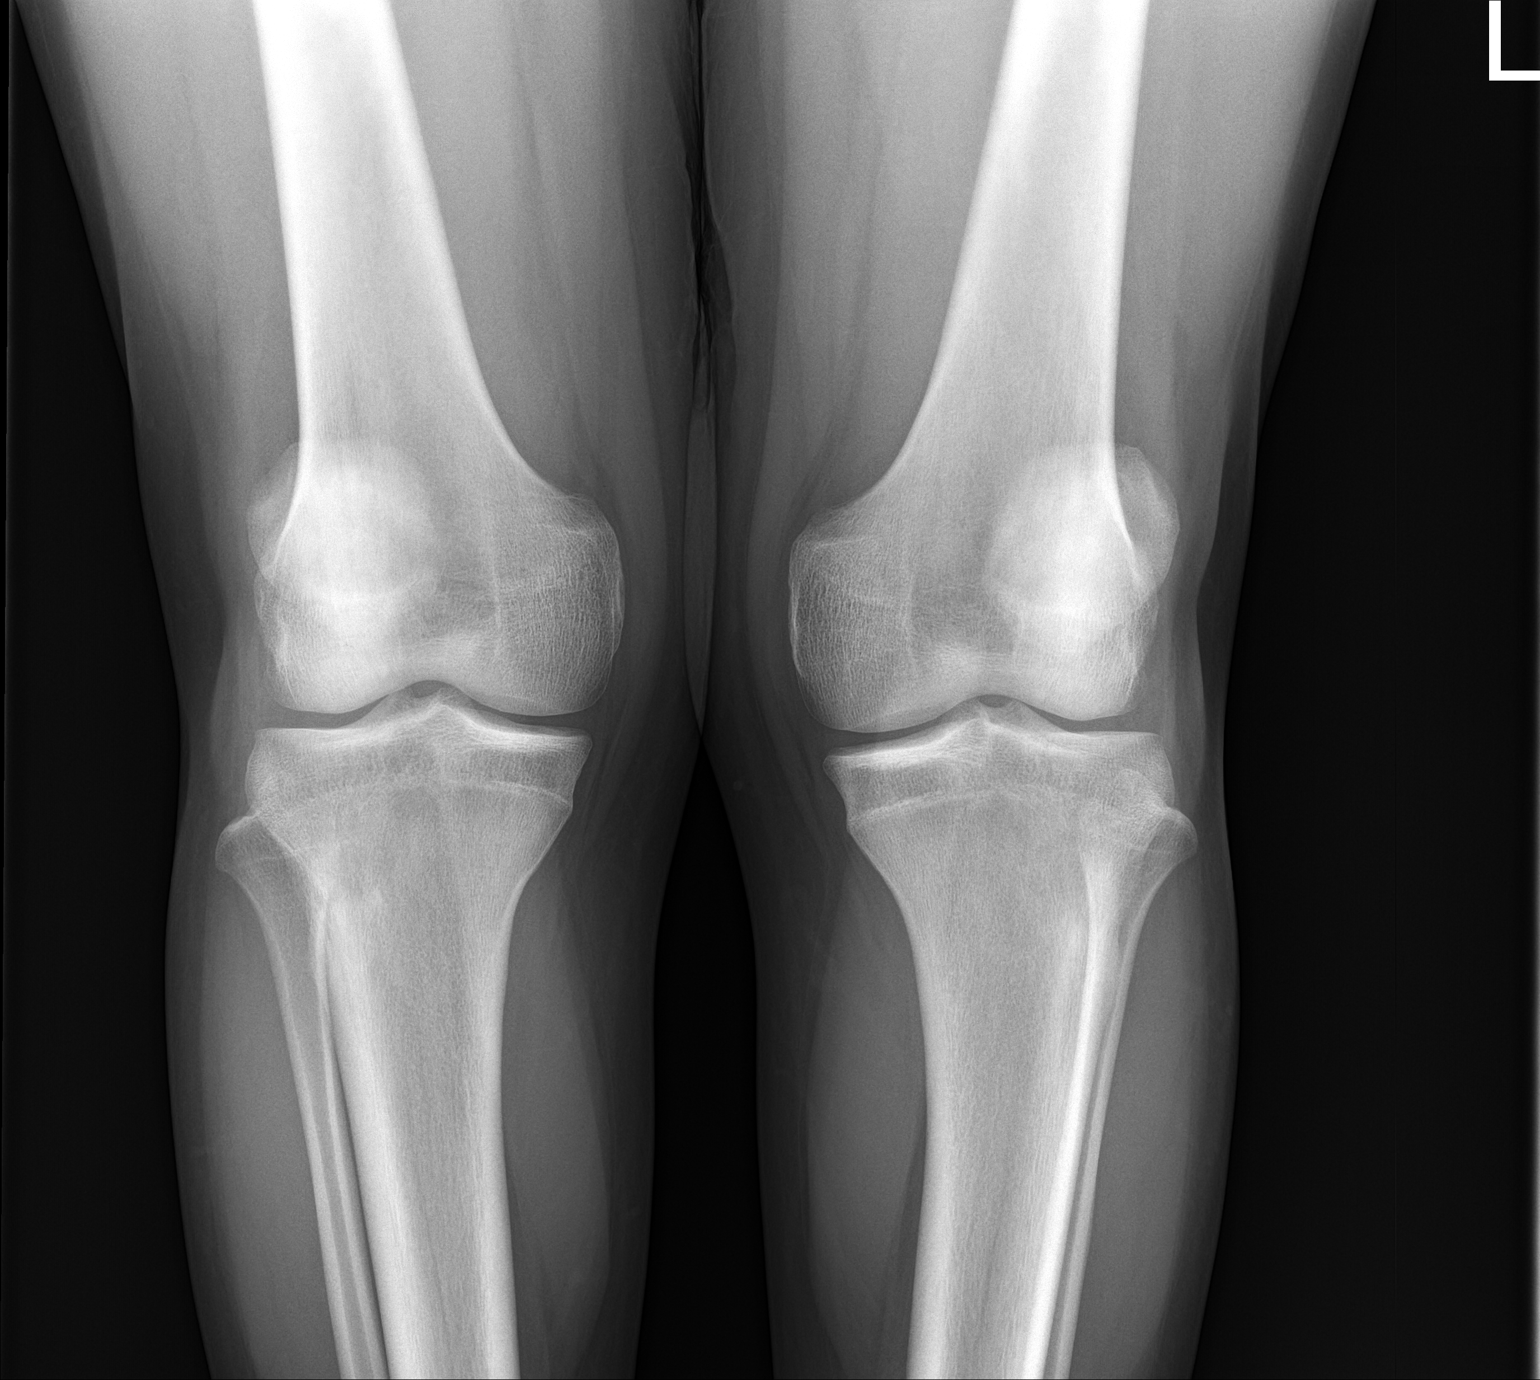

[knee lat]
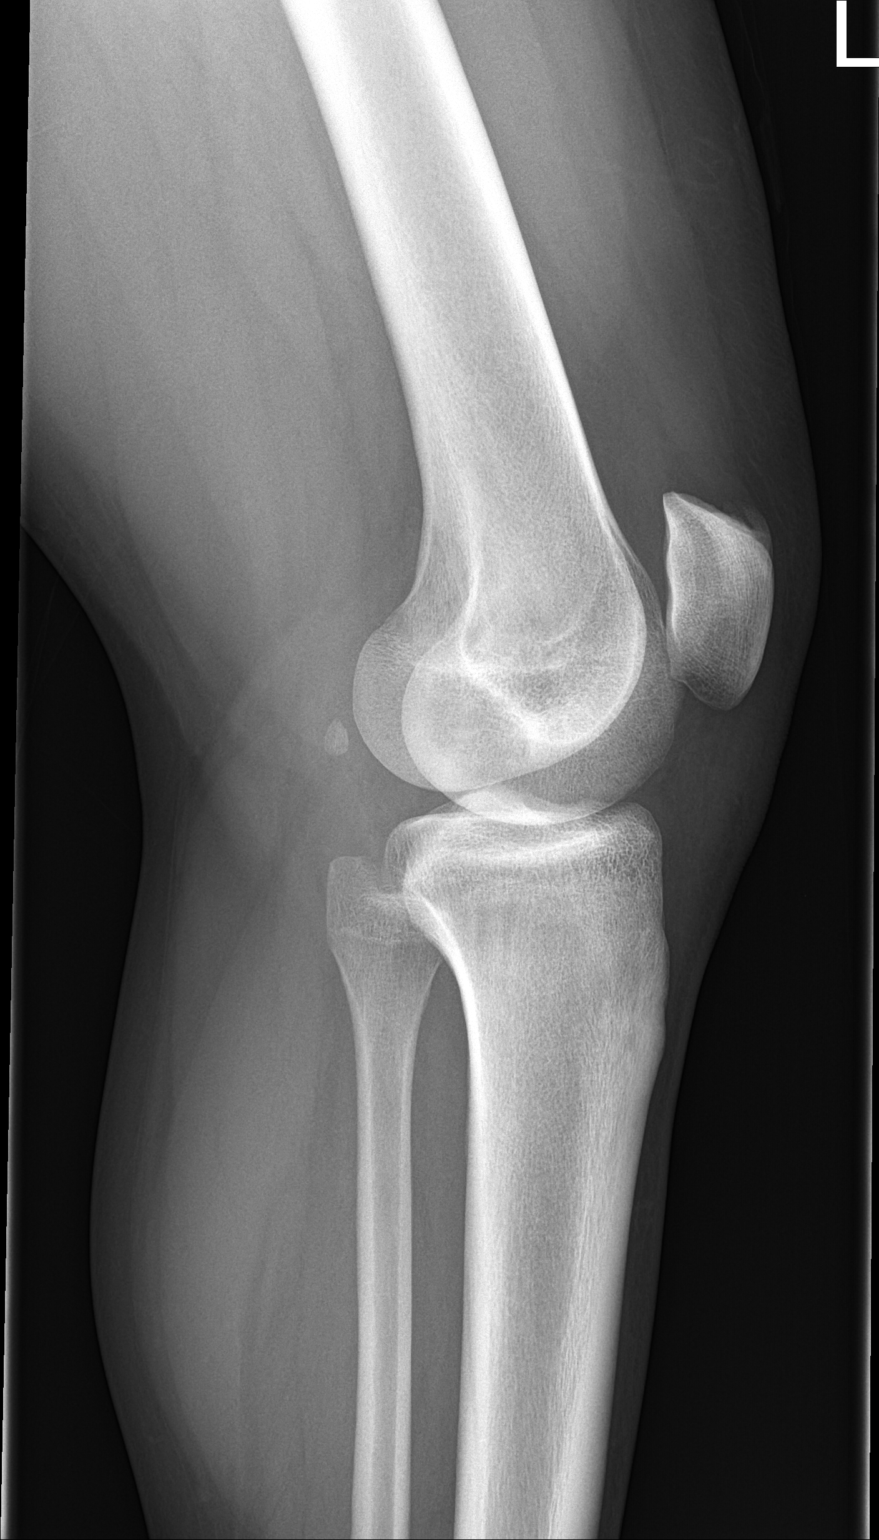

[2 of 2 positions shown; findings below may reference images not displayed]

FINDINGS: No evidence of fracture, dislocation, or joint effusion. No evidence
of arthropathy or other focal bone abnormality. Soft tissues are
unremarkable.
IMPRESSION: Negative.

## 2021-05-14 DEATH — deceased
